# Patient Record
Sex: Female | Born: 1970 | Race: White | Hispanic: No | Smoking: Never smoker
Health system: Southern US, Community
[De-identification: ages and names within clinical notes are randomized; demographics above are authoritative.]

## PROBLEM LIST (undated history)

## (undated) DIAGNOSIS — F419 Anxiety disorder, unspecified: Secondary | ICD-10-CM

## (undated) DIAGNOSIS — I10 Essential (primary) hypertension: Secondary | ICD-10-CM

## (undated) DIAGNOSIS — R519 Headache, unspecified: Secondary | ICD-10-CM

## (undated) HISTORY — DX: Headache, unspecified: R51.9

## (undated) HISTORY — DX: Anxiety disorder, unspecified: F41.9

## (undated) HISTORY — PX: WISDOM TOOTH EXTRACTION: SHX21

## (undated) HISTORY — DX: Essential (primary) hypertension: I10

## (undated) HISTORY — PX: CHOLECYSTECTOMY: SHX55

---

## 1999-04-24 ENCOUNTER — Other Ambulatory Visit: Admission: RE | Admit: 1999-04-24 | Discharge: 1999-04-24 | Payer: Self-pay | Admitting: Obstetrics & Gynecology

## 2000-04-24 ENCOUNTER — Other Ambulatory Visit: Admission: RE | Admit: 2000-04-24 | Discharge: 2000-04-24 | Payer: Self-pay | Admitting: Obstetrics & Gynecology

## 2001-05-13 ENCOUNTER — Other Ambulatory Visit: Admission: RE | Admit: 2001-05-13 | Discharge: 2001-05-13 | Payer: Self-pay | Admitting: Obstetrics & Gynecology

## 2002-05-14 ENCOUNTER — Other Ambulatory Visit: Admission: RE | Admit: 2002-05-14 | Discharge: 2002-05-14 | Payer: Self-pay | Admitting: Obstetrics & Gynecology

## 2003-06-01 ENCOUNTER — Other Ambulatory Visit: Admission: RE | Admit: 2003-06-01 | Discharge: 2003-06-01 | Payer: Self-pay | Admitting: Obstetrics & Gynecology

## 2004-08-03 ENCOUNTER — Other Ambulatory Visit: Admission: RE | Admit: 2004-08-03 | Discharge: 2004-08-03 | Payer: Self-pay | Admitting: Obstetrics & Gynecology

## 2005-08-27 ENCOUNTER — Other Ambulatory Visit: Admission: RE | Admit: 2005-08-27 | Discharge: 2005-08-27 | Payer: Self-pay | Admitting: Obstetrics & Gynecology

## 2012-02-07 ENCOUNTER — Other Ambulatory Visit: Payer: Self-pay | Admitting: Obstetrics & Gynecology

## 2012-02-07 DIAGNOSIS — R928 Other abnormal and inconclusive findings on diagnostic imaging of breast: Secondary | ICD-10-CM

## 2012-02-17 ENCOUNTER — Ambulatory Visit
Admission: RE | Admit: 2012-02-17 | Discharge: 2012-02-17 | Disposition: A | Discharge status: Home / Self Care | Payer: BC Managed Care – PPO | Source: Ambulatory Visit | Attending: Obstetrics & Gynecology | Admitting: Obstetrics & Gynecology

## 2012-02-17 DIAGNOSIS — R928 Other abnormal and inconclusive findings on diagnostic imaging of breast: Secondary | ICD-10-CM

## 2012-05-26 ENCOUNTER — Other Ambulatory Visit: Payer: Self-pay | Admitting: Obstetrics & Gynecology

## 2012-05-26 DIAGNOSIS — N63 Unspecified lump in unspecified breast: Secondary | ICD-10-CM

## 2012-05-29 ENCOUNTER — Ambulatory Visit
Admission: RE | Admit: 2012-05-29 | Discharge: 2012-05-29 | Disposition: A | Discharge status: Home / Self Care | Payer: BC Managed Care – PPO | Source: Ambulatory Visit | Attending: Obstetrics & Gynecology | Admitting: Obstetrics & Gynecology

## 2012-05-29 DIAGNOSIS — N63 Unspecified lump in unspecified breast: Secondary | ICD-10-CM

## 2020-06-13 ENCOUNTER — Encounter (HOSPITAL_BASED_OUTPATIENT_CLINIC_OR_DEPARTMENT_OTHER): Payer: Self-pay | Admitting: Obstetrics & Gynecology

## 2020-06-13 ENCOUNTER — Other Ambulatory Visit (HOSPITAL_COMMUNITY)
Admission: RE | Admit: 2020-06-13 | Discharge: 2020-06-13 | Disposition: A | Discharge status: Home / Self Care | Payer: Self-pay | Source: Ambulatory Visit | Attending: Obstetrics & Gynecology | Admitting: Obstetrics & Gynecology

## 2020-06-13 ENCOUNTER — Other Ambulatory Visit: Payer: Self-pay

## 2020-06-13 DIAGNOSIS — Z01812 Encounter for preprocedural laboratory examination: Secondary | ICD-10-CM | POA: Insufficient documentation

## 2020-06-13 DIAGNOSIS — Z20822 Contact with and (suspected) exposure to covid-19: Secondary | ICD-10-CM | POA: Insufficient documentation

## 2020-06-13 LAB — SARS CORONAVIRUS 2 (TAT 6-24 HRS): SARS Coronavirus 2: NEGATIVE

## 2020-06-13 NOTE — Progress Notes (Signed)
Pt needs ekg day of surgery

## 2020-06-13 NOTE — Progress Notes (Signed)
Spoke w/ via phone for pre-op interview---pt Lab needs dos----  UPT, T&S, Istat            Lab results------ COVID test ------06-13-20 Arrive at -------1015 AM NPO after MN NO Solid Food.  Clear liquids from MN until---0915 AM Medications to take morning of surgery -----Escitalopram and Alprazolam if needed Diabetic medication -----n/a Patient Special Instructions -----n/a Pre-Op special Istructions -----n/a Patient verbalized understanding of instructions that were given at this phone interview. Patient denies shortness of breath, chest pain, fever, cough at this phone interview.

## 2020-06-16 ENCOUNTER — Other Ambulatory Visit: Payer: Self-pay

## 2020-06-16 ENCOUNTER — Ambulatory Visit (HOSPITAL_BASED_OUTPATIENT_CLINIC_OR_DEPARTMENT_OTHER): Payer: Self-pay | Admitting: Anesthesiology

## 2020-06-16 ENCOUNTER — Encounter (HOSPITAL_BASED_OUTPATIENT_CLINIC_OR_DEPARTMENT_OTHER)
Admission: RE | Disposition: A | Discharge status: Home / Self Care | Payer: Self-pay | Source: Home / Self Care | Attending: Obstetrics & Gynecology

## 2020-06-16 ENCOUNTER — Ambulatory Visit (HOSPITAL_BASED_OUTPATIENT_CLINIC_OR_DEPARTMENT_OTHER)
Admission: RE | Admit: 2020-06-16 | Discharge: 2020-06-16 | Disposition: A | Discharge status: Home / Self Care | Payer: Self-pay | Attending: Obstetrics & Gynecology | Admitting: Obstetrics & Gynecology

## 2020-06-16 ENCOUNTER — Encounter (HOSPITAL_BASED_OUTPATIENT_CLINIC_OR_DEPARTMENT_OTHER): Payer: Self-pay | Admitting: Obstetrics & Gynecology

## 2020-06-16 DIAGNOSIS — Z6832 Body mass index (BMI) 32.0-32.9, adult: Secondary | ICD-10-CM | POA: Insufficient documentation

## 2020-06-16 DIAGNOSIS — N871 Moderate cervical dysplasia: Secondary | ICD-10-CM | POA: Insufficient documentation

## 2020-06-16 DIAGNOSIS — Z79899 Other long term (current) drug therapy: Secondary | ICD-10-CM | POA: Insufficient documentation

## 2020-06-16 DIAGNOSIS — F419 Anxiety disorder, unspecified: Secondary | ICD-10-CM | POA: Insufficient documentation

## 2020-06-16 DIAGNOSIS — Z882 Allergy status to sulfonamides status: Secondary | ICD-10-CM | POA: Insufficient documentation

## 2020-06-16 DIAGNOSIS — Z9049 Acquired absence of other specified parts of digestive tract: Secondary | ICD-10-CM | POA: Insufficient documentation

## 2020-06-16 DIAGNOSIS — E669 Obesity, unspecified: Secondary | ICD-10-CM | POA: Insufficient documentation

## 2020-06-16 DIAGNOSIS — R519 Headache, unspecified: Secondary | ICD-10-CM | POA: Insufficient documentation

## 2020-06-16 DIAGNOSIS — I10 Essential (primary) hypertension: Secondary | ICD-10-CM | POA: Insufficient documentation

## 2020-06-16 HISTORY — PX: CERVICAL CONIZATION W/BX: SHX1330

## 2020-06-16 LAB — POCT I-STAT, CHEM 8
BUN: 18 mg/dL (ref 6–20)
Calcium, Ion: 1.17 mmol/L (ref 1.15–1.40)
Chloride: 101 mmol/L (ref 98–111)
Creatinine, Ser: 0.7 mg/dL (ref 0.44–1.00)
Glucose, Bld: 85 mg/dL (ref 70–99)
HCT: 42 % (ref 36.0–46.0)
Hemoglobin: 14.3 g/dL (ref 12.0–15.0)
Potassium: 4.9 mmol/L (ref 3.5–5.1)
Sodium: 136 mmol/L (ref 135–145)
TCO2: 26 mmol/L (ref 22–32)

## 2020-06-16 LAB — POCT PREGNANCY, URINE: Preg Test, Ur: NEGATIVE

## 2020-06-16 SURGERY — CONE BIOPSY, CERVIX
Anesthesia: General

## 2020-06-16 MED ORDER — OXYCODONE HCL 5 MG/5ML PO SOLN: 5.0000 mg | Freq: Once | ORAL | Status: DC | PRN

## 2020-06-16 MED ORDER — IBUPROFEN 800 MG PO TABS: 800.0000 mg | ORAL_TABLET | Freq: Three times a day (TID) | ORAL | 0 refills | Status: AC | PRN

## 2020-06-16 MED ORDER — DEXAMETHASONE SODIUM PHOSPHATE 10 MG/ML IJ SOLN
INTRAMUSCULAR | Status: AC
  Filled 2020-06-16: qty 1

## 2020-06-16 MED ORDER — ACETAMINOPHEN 500 MG PO TABS
1000.0000 mg | ORAL_TABLET | ORAL | Status: AC
  Administered 2020-06-16: 1000 mg via ORAL

## 2020-06-16 MED ORDER — MEPERIDINE HCL 25 MG/ML IJ SOLN: 6.2500 mg | INTRAMUSCULAR | Status: DC | PRN

## 2020-06-16 MED ORDER — KETOROLAC TROMETHAMINE 15 MG/ML IJ SOLN
15.0000 mg | INTRAMUSCULAR | Status: AC
  Administered 2020-06-16: 30 mg via INTRAVENOUS

## 2020-06-16 MED ORDER — GABAPENTIN 300 MG PO CAPS
300.0000 mg | ORAL_CAPSULE | ORAL | Status: AC
  Administered 2020-06-16: 300 mg via ORAL

## 2020-06-16 MED ORDER — WHITE PETROLATUM EX OINT
TOPICAL_OINTMENT | CUTANEOUS | Status: AC
  Filled 2020-06-16: qty 5

## 2020-06-16 MED ORDER — LIDOCAINE 2% (20 MG/ML) 5 ML SYRINGE
INTRAMUSCULAR | Status: AC
  Filled 2020-06-16: qty 5

## 2020-06-16 MED ORDER — OXYCODONE HCL 5 MG PO TABS: 5.0000 mg | ORAL_TABLET | Freq: Once | ORAL | Status: DC | PRN

## 2020-06-16 MED ORDER — ONDANSETRON HCL 4 MG/2ML IJ SOLN
INTRAMUSCULAR | Status: AC
  Filled 2020-06-16: qty 2

## 2020-06-16 MED ORDER — LACTATED RINGERS IV SOLN: INTRAVENOUS | Status: DC

## 2020-06-16 MED ORDER — ACETAMINOPHEN 500 MG PO TABS
ORAL_TABLET | ORAL | Status: AC
  Filled 2020-06-16: qty 2

## 2020-06-16 MED ORDER — KETOROLAC TROMETHAMINE 30 MG/ML IJ SOLN
INTRAMUSCULAR | Status: AC
  Filled 2020-06-16: qty 1

## 2020-06-16 MED ORDER — ACETAMINOPHEN 160 MG/5ML PO SOLN: 325.0000 mg | ORAL | Status: DC | PRN

## 2020-06-16 MED ORDER — MIDAZOLAM HCL 2 MG/2ML IJ SOLN
INTRAMUSCULAR | Status: DC | PRN
  Administered 2020-06-16: 2 mg via INTRAVENOUS

## 2020-06-16 MED ORDER — GABAPENTIN 300 MG PO CAPS
ORAL_CAPSULE | ORAL | Status: AC
  Filled 2020-06-16: qty 1

## 2020-06-16 MED ORDER — MIDAZOLAM HCL 2 MG/2ML IJ SOLN
INTRAMUSCULAR | Status: AC
  Filled 2020-06-16: qty 2

## 2020-06-16 MED ORDER — ONDANSETRON HCL 4 MG/2ML IJ SOLN
INTRAMUSCULAR | Status: DC | PRN
  Administered 2020-06-16: 4 mg via INTRAVENOUS

## 2020-06-16 MED ORDER — FENTANYL CITRATE (PF) 100 MCG/2ML IJ SOLN
INTRAMUSCULAR | Status: AC
  Filled 2020-06-16: qty 2

## 2020-06-16 MED ORDER — ACETAMINOPHEN 325 MG PO TABS: 325.0000 mg | ORAL_TABLET | ORAL | Status: DC | PRN

## 2020-06-16 MED ORDER — ENSURE PRE-SURGERY PO LIQD: 296.0000 mL | Freq: Once | ORAL | Status: DC

## 2020-06-16 MED ORDER — IODINE STRONG (LUGOLS) 5 % PO SOLN
ORAL | Status: DC | PRN
  Administered 2020-06-16: 0.2 mL via ORAL

## 2020-06-16 MED ORDER — FERRIC SUBSULFATE SOLN
Status: DC | PRN
  Administered 2020-06-16: 1

## 2020-06-16 MED ORDER — LIDOCAINE 2% (20 MG/ML) 5 ML SYRINGE
INTRAMUSCULAR | Status: DC | PRN
  Administered 2020-06-16: 80 mg via INTRAVENOUS

## 2020-06-16 MED ORDER — POVIDONE-IODINE 10 % EX SWAB: 2.0000 "application " | Freq: Once | CUTANEOUS | Status: DC

## 2020-06-16 MED ORDER — LIDOCAINE-EPINEPHRINE (PF) 1 %-1:200000 IJ SOLN
INTRAMUSCULAR | Status: DC | PRN
  Administered 2020-06-16: 10 mL

## 2020-06-16 MED ORDER — DEXAMETHASONE SODIUM PHOSPHATE 10 MG/ML IJ SOLN
INTRAMUSCULAR | Status: DC | PRN
  Administered 2020-06-16: 5 mg via INTRAVENOUS

## 2020-06-16 MED ORDER — FENTANYL CITRATE (PF) 100 MCG/2ML IJ SOLN
INTRAMUSCULAR | Status: DC | PRN
Start: 2020-06-16 — End: 2020-06-16
  Administered 2020-06-16: 50 ug via INTRAVENOUS
  Administered 2020-06-16 (×2): 25 ug via INTRAVENOUS

## 2020-06-16 MED ORDER — KETOROLAC TROMETHAMINE 30 MG/ML IJ SOLN: 30.0000 mg | Freq: Once | INTRAMUSCULAR | Status: DC | PRN

## 2020-06-16 MED ORDER — PROPOFOL 10 MG/ML IV BOLUS
INTRAVENOUS | Status: DC | PRN
  Administered 2020-06-16: 160 mg via INTRAVENOUS

## 2020-06-16 MED ORDER — ONDANSETRON HCL 4 MG/2ML IJ SOLN: 4.0000 mg | Freq: Once | INTRAMUSCULAR | Status: DC | PRN

## 2020-06-16 MED ORDER — FENTANYL CITRATE (PF) 100 MCG/2ML IJ SOLN: 25.0000 ug | INTRAMUSCULAR | Status: DC | PRN

## 2020-06-16 MED ORDER — SCOPOLAMINE 1 MG/3DAYS TD PT72
MEDICATED_PATCH | TRANSDERMAL | Status: AC
  Filled 2020-06-16: qty 1

## 2020-06-16 MED ORDER — SCOPOLAMINE 1 MG/3DAYS TD PT72
1.0000 | MEDICATED_PATCH | TRANSDERMAL | Status: DC
  Administered 2020-06-16: 1.5 mg via TRANSDERMAL

## 2020-06-16 SURGICAL SUPPLY — 24 items
APPLICATOR COTTON TIP 6 STRL (MISCELLANEOUS) ×1 IMPLANT
APPLICATOR COTTON TIP 6IN STRL (MISCELLANEOUS) ×3
BLADE SURG 11 STRL SS (BLADE) ×3 IMPLANT
CATH ROBINSON RED A/P 16FR (CATHETERS) ×3 IMPLANT
COVER WAND RF STERILE (DRAPES) ×3 IMPLANT
ELECT BALL LEEP 5MM RED (ELECTRODE) ×3 IMPLANT
GLOVE BIOGEL PI IND STRL 6.5 (GLOVE) ×2 IMPLANT
GLOVE BIOGEL PI IND STRL 7.0 (GLOVE) ×1 IMPLANT
GLOVE BIOGEL PI INDICATOR 6.5 (GLOVE) ×4
GLOVE BIOGEL PI INDICATOR 7.0 (GLOVE) ×2
GLOVE ECLIPSE 6.5 STRL STRAW (GLOVE) ×3 IMPLANT
GOWN STRL REUS W/ TWL LRG LVL3 (GOWN DISPOSABLE) ×1 IMPLANT
GOWN STRL REUS W/TWL LRG LVL3 (GOWN DISPOSABLE) ×3
HIBICLENS CHG 4% 4OZ (MISCELLANEOUS) ×3 IMPLANT
NEEDLE SPNL 18GX3.5 QUINCKE PK (NEEDLE) ×3 IMPLANT
NS IRRIG 1000ML POUR BTL (IV SOLUTION) ×3 IMPLANT
PACK VAGINAL WOMENS (CUSTOM PROCEDURE TRAY) ×3 IMPLANT
PAD OB MATERNITY 4.3X12.25 (PERSONAL CARE ITEMS) ×3 IMPLANT
PENCIL SMOKE EVACUATOR (MISCELLANEOUS) ×3 IMPLANT
SCOPETTES 8  STERILE (MISCELLANEOUS) ×6
SCOPETTES 8 STERILE (MISCELLANEOUS) ×2 IMPLANT
SUT SILK 2 0 SH (SUTURE) ×3 IMPLANT
SUT VICRYL 0 UR6 27IN ABS (SUTURE) ×6 IMPLANT
TOWEL OR 17X26 10 PK STRL BLUE (TOWEL DISPOSABLE) ×3 IMPLANT

## 2020-06-16 NOTE — H&P (Signed)
Tamara Long is an 49 y.o. female. Here for [facility].History of abnormal pap smears. Denies vaginal bleeding. 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 Anxiety   . Headache   . Hypertension     Past Surgical History:  Procedure Laterality Date  . CHOLECYSTECTOMY     1994  . WISDOM TOOTH EXTRACTION      History reviewed. No pertinent family history.  Social History:  reports that she has never smoked. She has never used smokeless tobacco. She reports previous alcohol use. No history on file for drug use.  Allergies:  Allergies  Allergen Reactions  . Sulfa Antibiotics Itching    Medications Prior to Admission  Medication Sig Dispense Refill Last Dose  . ALPRAZolam (XANAX) 0.25 MG tablet Take 0.25 mg by mouth at bedtime as needed for anxiety. PRN anxiety   Past Month at Unknown time  . escitalopram (LEXAPRO) 20 MG tablet Take 20 mg by mouth daily. Takes morning   06/15/2020 at Unknown time  . lisinopril (ZESTRIL) 10 MG tablet Take 10 mg by mouth daily. Takes evening   06/15/2020 at Unknown time  . Norgestimate-Ethinyl Estradiol Triphasic (TRI-SPRINTEC) 0.18/0.215/0.25 MG-35 MCG tablet Take 1 tablet by mouth daily. Takes evening   06/15/2020 at Unknown time  . triamterene-hydrochlorothiazide (MAXZIDE) 75-50 MG tablet Take 1 tablet by mouth daily. Takes evening   06/15/2020 at Unknown time  . neomycin-bacitracin-polymyxin (NEOSPORIN) OINT Apply 1 application topically.   More than a month at Unknown time    Review of Systems - per HPI all other pertinent ROS neg  Blood pressure (!) 172/86, pulse 76, temperature 98.8 F (37.1 C), temperature source Oral, resp. rate 16, height 5\' 2"  (1.575 m), weight 80 kg, last menstrual period 06/07/2020, SpO2 99 %. Physical Exam  Consult at [facility] 05/23/2020. Constitutional *General Appearance: healthy-appearing  Head Head:  normocephalic  Cardiovascular *Auscultation: RRR, no murmur  Lungs *Respiratory Effort: no accessory muscle usage *Auscultation: clear to auscultation  Results for orders placed or performed during the hospital encounter of 06/16/20 (from the past 24 hour(s))  Pregnancy, urine POC     Status: None   Collection Time: 06/16/20 10:21 AM  Result Value Ref Range   Preg Test, Ur NEGATIVE NEGATIVE  I-STAT, chem 8     Status: None   Collection Time: 06/16/20 10:35 AM  Result Value Ref Range   Sodium 136 135 - 145 mmol/L   Potassium 4.9 3.5 - 5.1 mmol/L   Chloride 101 98 - 111 mmol/L   BUN 18 6 - 20 mg/dL   Creatinine, Ser 08/16/20 0.44 - 1.00 mg/dL   Glucose, Bld 85 70 - 99 mg/dL   Calcium, Ion 5.46 5.68 - 1.40 mmol/L   TCO2 26 22 - 32 mmol/L   Hemoglobin 14.3 12.0 - 15.0 g/dL   HCT 1.27 36 - 46 %    Assessment/Plan: 48yo G2P2 here for cold knife conization due to abnormal pap smear history, most recent pap smear, 05/02/2020: ASC-H, HPV HR positive, [facility]P recommends colposcopy or treatment. Discussed options with patient, she opts for treatment, discussed LEEP vs. CKC. Patient prefers CKC in the OR. -Discussed restrictions, ok to return to work without restrictions, pelvic rest x 4 weeks.   Parisa Pinela K Taam-Akelman 06/16/2020, 11:09 AM

## 2020-06-16 NOTE — Discharge Instructions (Signed)
For pain ok to take Motrin 800mg  every 8 hours for pain Acetaminophen 650mg  every 6 hours for pain   Vaginal, Care After This sheet gives you information about how to care for yourself after your procedure. Your health care provider may also give you more specific instructions. If you have problems or questions, contact your health care provider. What can I expect after the procedure? After the procedure, it is common to have:  Pain in the surgical area.  Vaginal discharge. You will need to use a sanitary pad during this time.  Fatigue. Follow these instructions at home:   Do not take baths, swim, or use a hot tub until your health care provider approves. You may shower.  Keep the area between your vagina and rectum (perineal area) clean and dry. Make sure you clean the area after each bowel movement and each time you urinate.   Activity  Do gentle, daily activity as told by your health care provider. You may be told to take short walks every day and go farther each time. Ask your health care provider what activities are safe for you.  Do not douche, use tampons, or have sex until your health care provider says it is okay.  Do not drive or use heavy machinery while taking prescription pain medicine. To prevent constipation  To prevent or treat constipation while you are taking prescription pain medicine, your health care provider may recommend that you: ? Take over-the-counter or prescription medicines. ? Eat foods that are high in fiber, such as fresh fruits and vegetables, whole grains, and beans. ? Drink enough fluid to keep your urine clear or pale yellow. ? Limit foods that are high in fat and processed sugars, such as fried and sweet foods. General instructions  You may be instructed to do pelvic floor exercises (kegels) as told by your health care provider.  Take over-the-counter and prescription medicines only as told by your health care provider.  Keep all  follow-up visits as told by your health care provider. This is important. Contact a health care provider if:  Medicine does not help your pain.  You have frequent or urgent urination, or you are unable to completely empty your bladder.  You feel a burning sensation when urinating.  You have fluid or blood coming from your incision.  You have pus or a bad smell coming from the incision.  Your incision feels warm to the touch.  You have redness, swelling, or pain around your incision. Get help right away if:  You have a fever or chills.  Your incision separates or opens.  You cannot urinate.  You have trouble breathing. Summary  After the procedure, it is common to have pain, fatigue, and discharge from the vagina.  Keep the area between your vagina and rectum (perineal area) clean and dry. Make sure you clean the area after each bowel movement and each time you urinate.  Follow instructions from your health care provider on any activity restrictions after the procedure. This information is not intended to replace advice given to you by your health care provider. Make sure you discuss any questions you have with your health care provider. Document Revised: 08/15/2017 Document Reviewed: 09/02/2016 Elsevier Patient Education  2020 08/17/2017.    Post Anesthesia Home Care Instructions  Activity: Get plenty of rest for the remainder of the day. A responsible individual must stay with you for 24 hours following the procedure.  For the next 24 hours, DO NOT: -Drive  a car -Advertising copywriter -Drink alcoholic beverages -Take any medication unless instructed by your physician -Make any legal decisions or sign important papers.  Meals: Start with liquid foods such as gelatin or soup. Progress to regular foods as tolerated. Avoid greasy, spicy, heavy foods. If nausea and/or vomiting occur, drink only clear liquids until the nausea and/or vomiting subsides. Call your physician  if vomiting continues.  Special Instructions/Symptoms: Your throat may feel dry or sore from the anesthesia or the breathing tube placed in your throat during surgery. If this causes discomfort, gargle with warm salt water. The discomfort should disappear within 24 hours.  Take Tylenol at 6 PM as needed for pain.

## 2020-06-16 NOTE — Brief Op Note (Signed)
06/16/2020  12:32 PM  PATIENT:  Tamara Long  49 y.o. female  PRE-OPERATIVE DIAGNOSIS:  ATYPICAL SQUAMOUS CELLS, CANNOT EXCLUDE HIGH-GRADE SQUAMOUS INTRAEPITHELIAL LESION (ASC-H), HPV positive  POST-OPERATIVE DIAGNOSIS: same  PROCEDURE:  Procedure(s): CONIZATION CERVIX WITH BIOPSY (N/A)  SURGEON:  Surgeon(s) and Role:    * Taam-Akelman, Griselda Miner, MD - Primary  PHYSICIAN ASSISTANT: none  ASSISTANTS: none   ANESTHESIA:   general  EBL: minimal  BLOOD ADMINISTERED:none  DRAINS: none   LOCAL MEDICATIONS USED:  LIDOCAINE with epinephrine  SPECIMEN:  Source of Specimen:  cervix, endocervical curettage  DISPOSITION OF SPECIMEN:  PATHOLOGY  COUNTS:  YES  TOURNIQUET:  * No tourniquets in log *  DICTATION: .Note written in EPIC  PLAN OF CARE: Discharge to home after PACU  PATIENT DISPOSITION:  PACU - hemodynamically stable.   Delay start of Pharmacological VTE agent (>24hrs) due to surgical blood loss or risk of bleeding: not applicable

## 2020-06-16 NOTE — Anesthesia Procedure Notes (Signed)
Procedure Name: LMA Insertion Date/Time: 06/16/2020 12:00 PM Performed by: Francie Massing, CRNA Pre-anesthesia Checklist: Patient identified, Emergency Drugs available, Suction available and Patient being monitored Patient Re-evaluated:Patient Re-evaluated prior to induction Oxygen Delivery Method: Circle system utilized Preoxygenation: Pre-oxygenation with 100% oxygen Induction Type: IV induction Ventilation: Mask ventilation without difficulty LMA: LMA inserted LMA Size: 4.0 Number of attempts: 1 Airway Equipment and Method: Bite block Placement Confirmation: positive ETCO2 Tube secured with: Tape Dental Injury: Teeth and Oropharynx as per pre-operative assessment

## 2020-06-16 NOTE — Transfer of Care (Signed)
Immediate Anesthesia Transfer of Care Note  Patient: Tamara Long  Procedure(s) Performed: Procedure(s) (LRB): CONIZATION CERVIX WITH BIOPSY CUETTAGE (N/A)  Patient Location: PACU  Anesthesia Type: General  Level of Consciousness: awake, oriented, sedated and patient cooperative  Airway & Oxygen Therapy: Patient Spontanous Breathing and Patient connected to face mask oxygen  Post-op Assessment: Report given to PACU RN and Post -op Vital signs reviewed and stable  Post vital signs: Reviewed and stable  Complications: No apparent anesthesia complications  Last Vitals:  Vitals Value Taken Time  BP 146/79 06/16/20 1245  Temp 36.8 C 06/16/20 1245  Pulse 85 06/16/20 1249  Resp 12 06/16/20 1249  SpO2 98 % 06/16/20 1249  Vitals shown include unvalidated device data.  Last Pain:  Vitals:   06/16/20 1040  TempSrc: Oral      Patients Stated Pain Goal: 3 (06/16/20 1040)  Complications: No complications documented.

## 2020-06-16 NOTE — Anesthesia Preprocedure Evaluation (Signed)
Anesthesia Evaluation  Patient identified by MRN, date of birth, ID band Patient awake    Reviewed: Allergy & Precautions, NPO status , Patient's Chart, lab work & pertinent test results  Airway Mallampati: I       Dental no notable dental hx.    Pulmonary neg pulmonary ROS,    Pulmonary exam normal        Cardiovascular hypertension, Pt. on medications Normal cardiovascular exam     Neuro/Psych  Headaches, PSYCHIATRIC DISORDERS Anxiety    GI/Hepatic negative GI ROS, Neg liver ROS,   Endo/Other  negative endocrine ROS  Renal/GU negative Renal ROS  negative genitourinary   Musculoskeletal negative musculoskeletal ROS (+)   Abdominal (+) + obese,   Peds  Hematology negative hematology ROS (+)   Anesthesia Other Findings   Reproductive/Obstetrics                             Anesthesia Physical Anesthesia Plan  ASA: II  Anesthesia Plan: General   Post-op Pain Management:    Induction:   PONV Risk Score and Plan: 4 or greater and Ondansetron, Dexamethasone and Midazolam  Airway Management Planned: LMA  Additional Equipment: None  Intra-op Plan:   Post-operative Plan: Extubation in OR  Informed Consent: I have reviewed the patients History and Physical, chart, labs and discussed the procedure including the risks, benefits and alternatives for the proposed anesthesia with the patient or authorized representative who has indicated his/her understanding and acceptance.     Dental advisory given  Plan Discussed with: CRNA  Anesthesia Plan Comments:         Anesthesia Quick Evaluation

## 2020-06-16 NOTE — Op Note (Signed)
Operative Note  PATIENT:  Tamara Long  49 y.o. female  PRE-OPERATIVE DIAGNOSIS:  ATYPICAL SQUAMOUS CELLS, CANNOT EXCLUDE HIGH-GRADE SQUAMOUS INTRAEPITHELIAL LESION (ASC-H), HPV positive  POST-OPERATIVE DIAGNOSIS: same  PROCEDURE:  Procedure(s): CONIZATION CERVIX WITH BIOPSY (N/A)  SURGEON:  Surgeon(s) and Role:    * Taam-Akelman, Griselda Miner, MD - Primary  ANESTHESIA:   general  EBL: minimal  BLOOD ADMINISTERED:none  DRAINS: none   LOCAL MEDICATIONS USED: 10cc 1% LIDOCAINE with epinephrine  SPECIMEN:  Source of Specimen:  portion of cervix (stitch at 12 o'clock), endocervical curettage  DISPOSITION OF SPECIMEN:  PATHOLOGY  COUNTS:  YES  TOURNIQUET:  * No tourniquets in log *  PLAN OF CARE: Discharge to home after PACU  PATIENT DISPOSITION:  PACU - hemodynamically stable.   Delay start of Pharmacological VTE agent (>24hrs) due to surgical blood loss or risk of bleeding: not applicable  Findings: Lugols applied, cervix completely stained. The squamo-columnar junction was visualized.  Description of procedure After consent was verified, the patient was taken to the operating room where general anesthesia was administered without difficulty. The patient was placed in the dorsal lithotomy position using Allen stirrups.  The vagina were subsequently prepped and draped in the normal sterile fashion. Bladder was drained with in and out catheter. A speculum was placed in the patient's vagina. Lugols was applied on the cervix and it was completely stained. Stay sutures were placed at 3 and 9 o'clock with vicryl which was tied down and held. Silk suture was placed at 12 o'clock. The cervix was then injected circumferentially with 1% lidocaine with epinephrine. A #11 blade scalpel was used to circumferentially excise the central portion of the cervix. The cone specimen measured 2cm x 1.5cm and was handed off to pathology. The endocervical curettage was performed  without difficulty. The base of the cone was cauterized and excellent hemostasis was noted. The stay sutures were cut. Monsels placed on the cervix bed. All instruments were removed.  The patient tolerated the procedure well and was taken to the recovery room in stable condition. Sponge, lap, and needle counts were correct x3.  Ziza Hastings K Taam-Akelman 06/16/20 12:50 PM

## 2020-06-19 ENCOUNTER — Encounter (HOSPITAL_BASED_OUTPATIENT_CLINIC_OR_DEPARTMENT_OTHER): Payer: Self-pay | Admitting: Obstetrics & Gynecology

## 2020-06-19 NOTE — Anesthesia Postprocedure Evaluation (Signed)
Anesthesia Post Note  Patient: Tamara Long  Procedure(s) Performed: CONIZATION CERVIX WITH BIOPSY CUETTAGE (N/A )     Anesthesia Type: General Level of consciousness: awake Pain management: pain level controlled Vital Signs Assessment: post-procedure vital signs reviewed and stable Respiratory status: spontaneous breathing Cardiovascular status: stable Postop Assessment: no apparent nausea or vomiting Anesthetic complications: no   No complications documented.  Last Vitals:  Vitals:   06/16/20 1400 06/16/20 1423  BP: (!) 141/78 127/74  Pulse: 82 78  Resp: 14 15  Temp:  36.8 C  SpO2: 93% 94%    Last Pain:  Vitals:   06/16/20 1423  TempSrc:   PainSc: 0-No pain                 Caren Macadam

## 2020-06-20 LAB — SURGICAL PATHOLOGY

## 2021-01-23 ENCOUNTER — Other Ambulatory Visit: Payer: Self-pay | Admitting: Obstetrics & Gynecology

## 2021-01-23 DIAGNOSIS — R928 Other abnormal and inconclusive findings on diagnostic imaging of breast: Secondary | ICD-10-CM

## 2021-02-14 ENCOUNTER — Other Ambulatory Visit: Payer: Self-pay

## 2021-02-14 ENCOUNTER — Other Ambulatory Visit: Payer: Self-pay | Admitting: Obstetrics & Gynecology

## 2021-02-14 ENCOUNTER — Ambulatory Visit
Admission: RE | Admit: 2021-02-14 | Discharge: 2021-02-14 | Disposition: A | Discharge status: Home / Self Care | Payer: BC Managed Care – PPO | Source: Ambulatory Visit | Attending: Obstetrics & Gynecology | Admitting: Obstetrics & Gynecology

## 2021-02-14 DIAGNOSIS — R928 Other abnormal and inconclusive findings on diagnostic imaging of breast: Secondary | ICD-10-CM

## 2021-08-20 ENCOUNTER — Ambulatory Visit
Admission: RE | Admit: 2021-08-20 | Discharge: 2021-08-20 | Disposition: A | Discharge status: Home / Self Care | Payer: BC Managed Care – PPO | Source: Ambulatory Visit | Attending: Obstetrics & Gynecology | Admitting: Obstetrics & Gynecology

## 2021-08-20 ENCOUNTER — Other Ambulatory Visit: Payer: Self-pay | Admitting: Obstetrics & Gynecology

## 2021-08-20 DIAGNOSIS — R928 Other abnormal and inconclusive findings on diagnostic imaging of breast: Secondary | ICD-10-CM

## 2021-08-30 ENCOUNTER — Ambulatory Visit
Admission: RE | Admit: 2021-08-30 | Discharge: 2021-08-30 | Disposition: A | Discharge status: Home / Self Care | Payer: BC Managed Care – PPO | Source: Ambulatory Visit | Attending: Obstetrics & Gynecology | Admitting: Obstetrics & Gynecology

## 2021-08-30 DIAGNOSIS — R928 Other abnormal and inconclusive findings on diagnostic imaging of breast: Secondary | ICD-10-CM

## 2022-02-27 ENCOUNTER — Other Ambulatory Visit: Payer: Self-pay | Admitting: Obstetrics & Gynecology

## 2022-02-27 DIAGNOSIS — N6489 Other specified disorders of breast: Secondary | ICD-10-CM

## 2022-03-13 ENCOUNTER — Ambulatory Visit: Admission: RE | Admit: 2022-03-13 | Payer: BC Managed Care – PPO | Source: Ambulatory Visit

## 2022-03-13 ENCOUNTER — Ambulatory Visit
Admission: RE | Admit: 2022-03-13 | Discharge: 2022-03-13 | Disposition: A | Discharge status: Home / Self Care | Payer: BC Managed Care – PPO | Source: Ambulatory Visit | Attending: Obstetrics & Gynecology | Admitting: Obstetrics & Gynecology

## 2022-03-13 ENCOUNTER — Other Ambulatory Visit: Payer: Self-pay | Admitting: Obstetrics & Gynecology

## 2022-03-13 DIAGNOSIS — N6489 Other specified disorders of breast: Secondary | ICD-10-CM

## 2023-07-01 IMAGING — MG MM DIGITAL DIAGNOSTIC UNILAT*L* W/ TOMO W/ CAD
6 of 10 series · 6 of 30 positions shown · non-contrast
Comparison: Previous exams including LEFT breast diagnostic
mammogram and ultrasound dated 02/14/2021.

CLINICAL DATA: Follow-up for probably benign asymmetry within the
LEFT breast.

EXAM:
DIGITAL DIAGNOSTIC UNILATERAL LEFT MAMMOGRAM WITH TOMOSYNTHESIS AND
CAD
TECHNIQUE: Left digital diagnostic mammography and breast tomosynthesis was
performed. The images were evaluated with computer-aided detection.

[L CC synth-2D (1 of 2)]
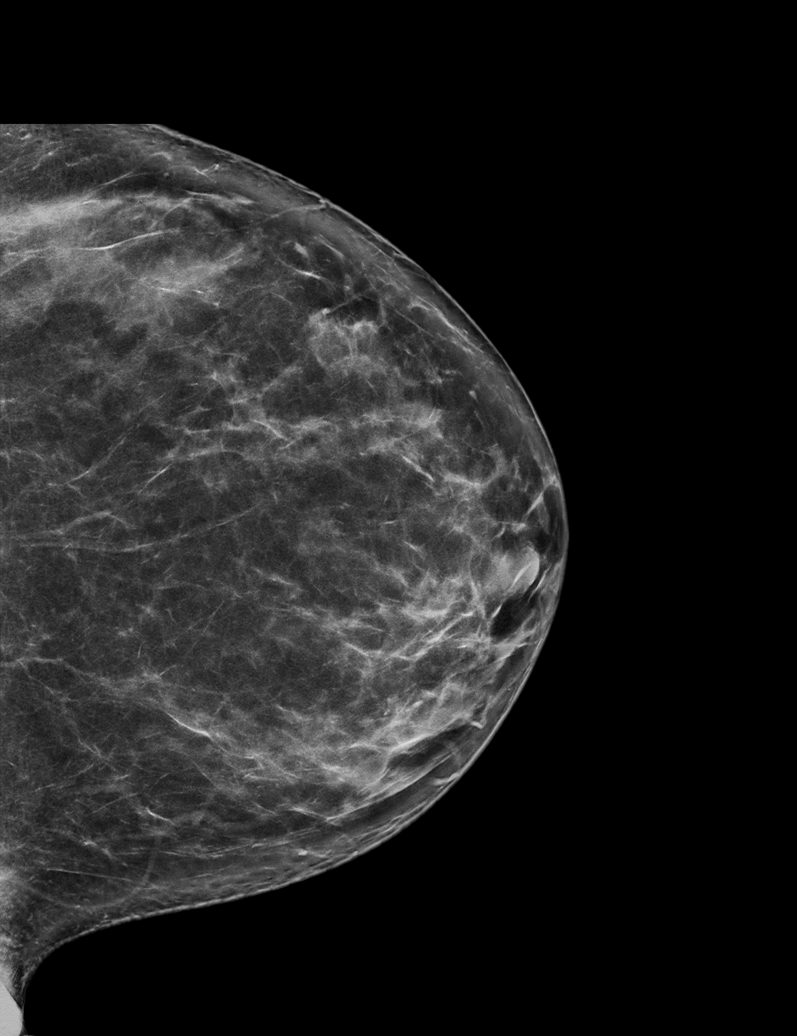

[L CC synth-2D (2 of 2)]
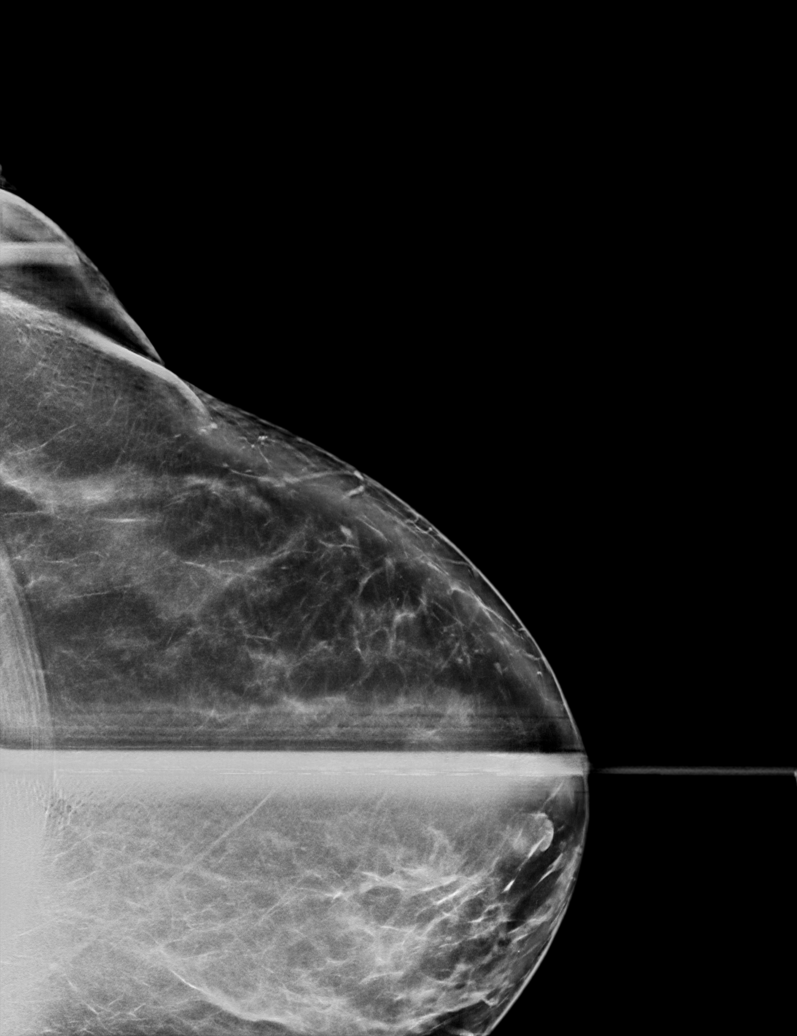

[L ML synth-2D]
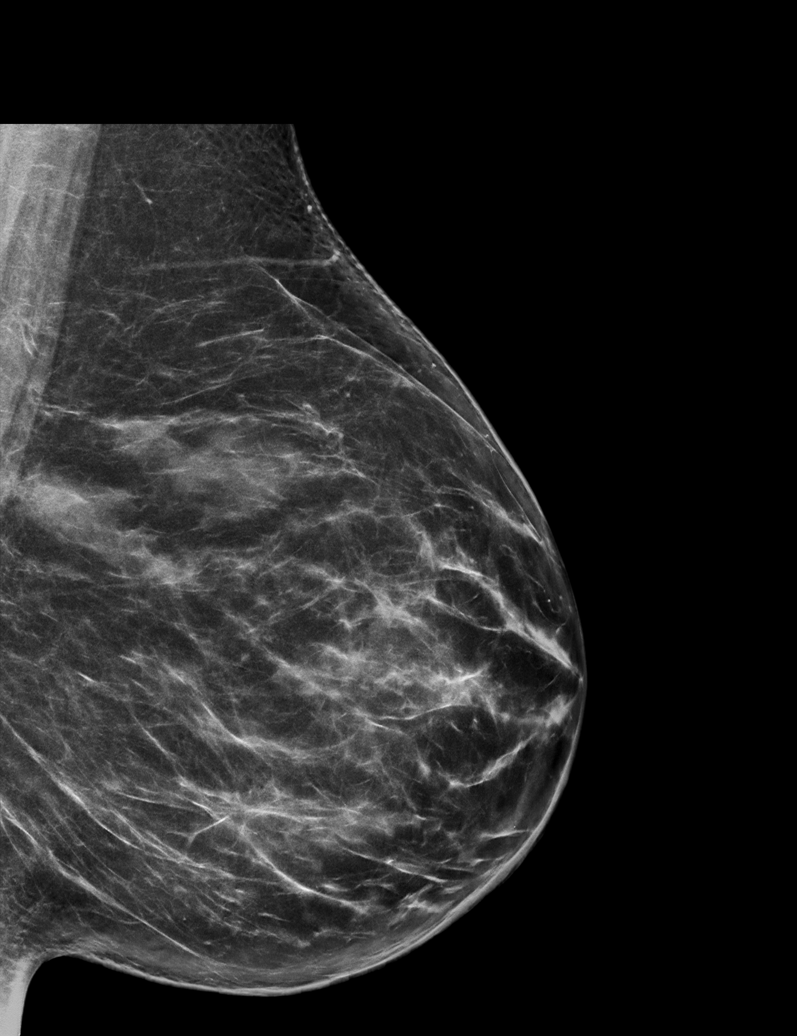

[L MLO synth-2D (1 of 2)]
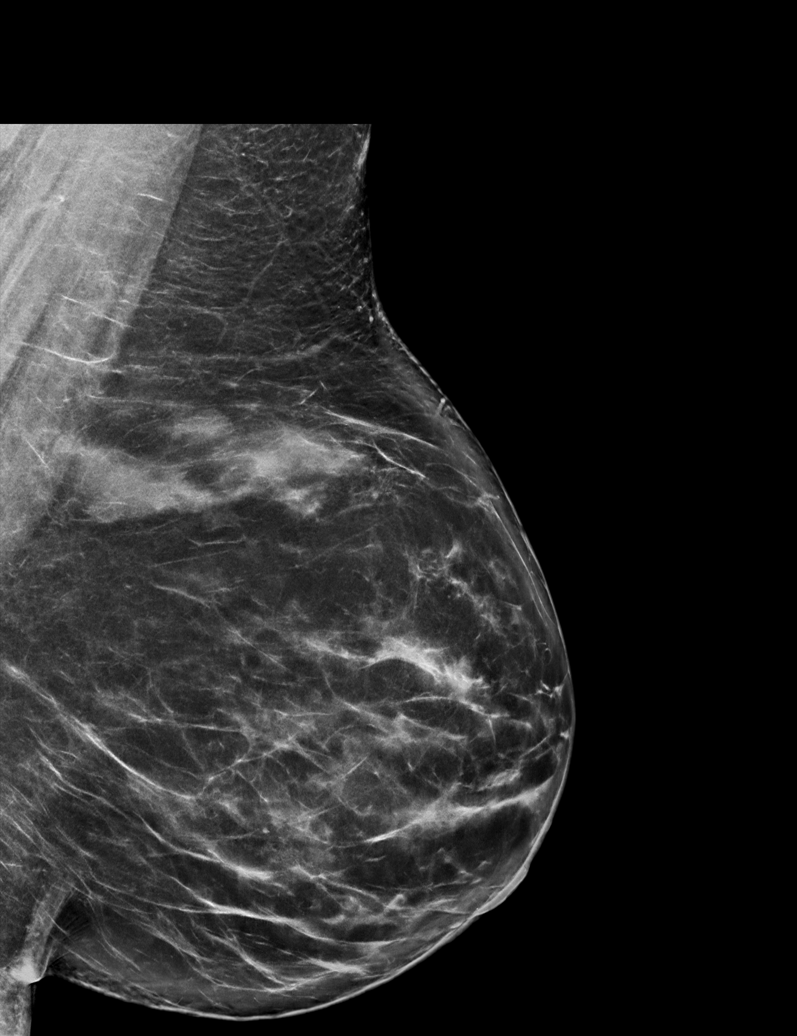

[L MLO synth-2D (2 of 2)]
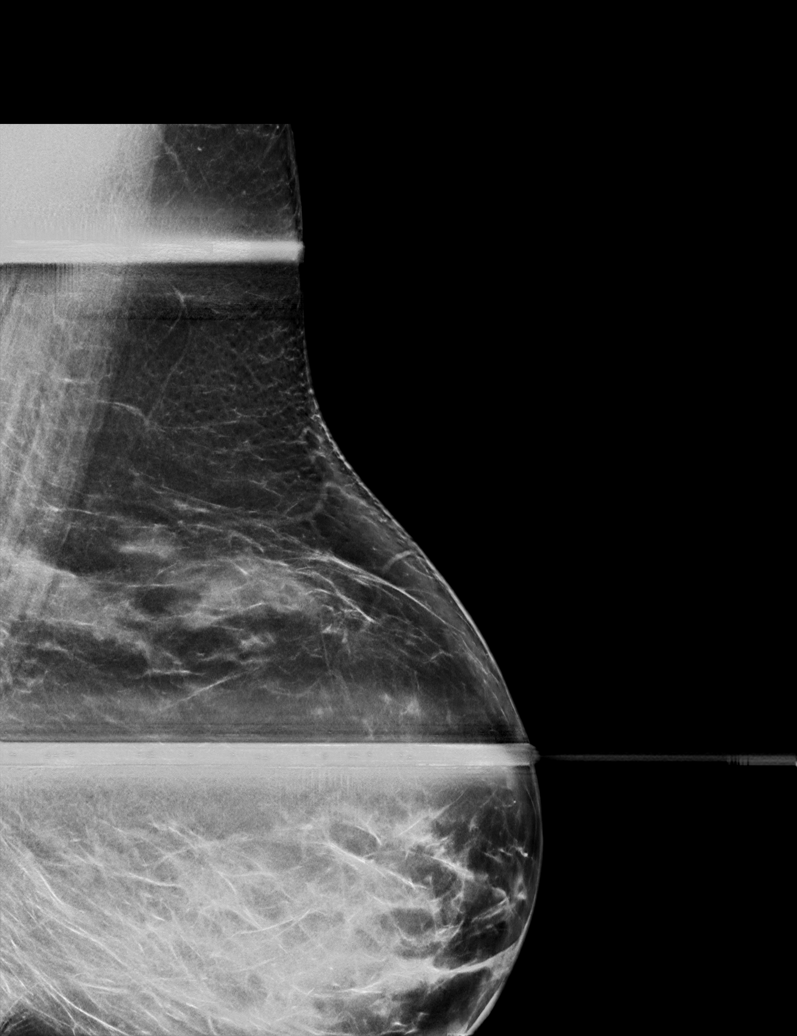

[L MLO tomo · tomo slice 41/81.0]
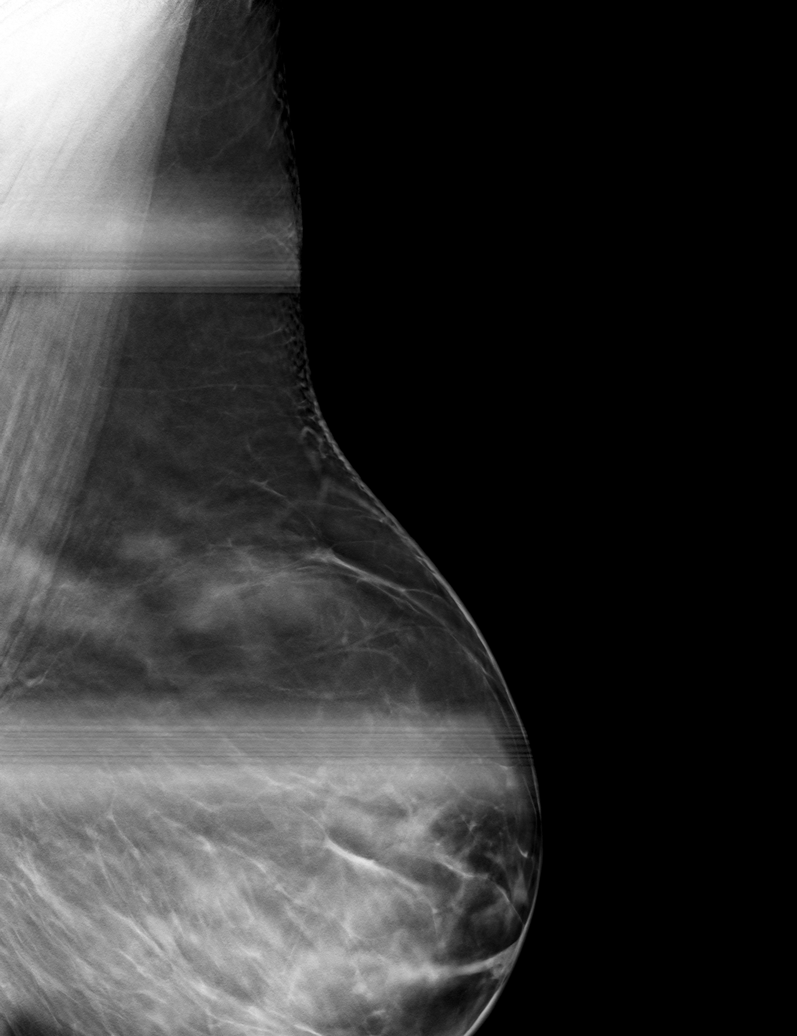

[6 of 30 positions shown; findings below may reference images not displayed]

ACR Breast Density Category b: There are scattered areas of
fibroglandular density.
FINDINGS: There is a developing asymmetry within the upper outer quadrant of
the LEFT breast, at middle depth, somewhat more conspicuous on MLO
views, MLO slice 58 and spot compression MLO slice 64. There was no
sonographic correlate identified on earlier ultrasound of
02/14/2021, therefore, this may represent an island of normal dense
fibroglandular tissue.
IMPRESSION: Developing asymmetry within the upper-outer quadrant of the LEFT
breast, at middle depth, most conspicuous on MLO views, without
sonographic correlate. Recommend stereotactic biopsy to ensure
benignity.

RECOMMENDATION:
Stereotactic biopsy of the developing asymmetry within the upper
LEFT breast, at middle depth, without sonographic correlate.

Stereotactic biopsy is scheduled on [REDACTED].

I have discussed the findings and recommendations with the patient.
If applicable, a reminder letter will be sent to the patient
regarding the next appointment.

BI-RADS CATEGORY  4: Suspicious.
